# Patient Record
Sex: Male | Born: 1950 | Race: Black or African American | Hispanic: No | Marital: Married
Health system: Southern US, Community
[De-identification: ages and names within clinical notes are randomized; demographics above are authoritative.]

---

## 2009-09-09 ENCOUNTER — Other Ambulatory Visit: Admission: RE | Admit: 2009-09-09 | Discharge: 2009-09-09 | Payer: Self-pay | Admitting: Interventional Radiology

## 2009-09-09 ENCOUNTER — Encounter: Admission: RE | Admit: 2009-09-09 | Discharge: 2009-09-09 | Payer: Self-pay | Admitting: Endocrinology

## 2010-01-01 ENCOUNTER — Encounter (INDEPENDENT_AMBULATORY_CARE_PROVIDER_SITE_OTHER): Payer: Self-pay | Admitting: Surgery

## 2010-01-01 ENCOUNTER — Ambulatory Visit (HOSPITAL_COMMUNITY)
Admission: RE | Admit: 2010-01-01 | Discharge: 2010-01-02 | Payer: Self-pay | Source: Home / Self Care | Attending: Surgery | Admitting: Surgery

## 2010-03-30 LAB — CALCIUM: Calcium: 9 mg/dL (ref 8.4–10.5)

## 2010-03-30 LAB — GLUCOSE, CAPILLARY: Glucose-Capillary: 116 mg/dL — ABNORMAL HIGH (ref 70–99)

## 2010-03-31 LAB — COMPREHENSIVE METABOLIC PANEL
ALT: 20 U/L (ref 0–53)
AST: 20 U/L (ref 0–37)
Alkaline Phosphatase: 56 U/L (ref 39–117)
BUN: 15 mg/dL (ref 6–23)
Calcium: 9.3 mg/dL (ref 8.4–10.5)
GFR calc Af Amer: >60 mL/min (ref >60)
GFR calc non Af Amer: >60 mL/min (ref >60)
Total Bilirubin: 0.7 mg/dL (ref 0.3–1.2)

## 2010-03-31 LAB — CBC
MCV: 91.5 fL (ref 78.0–100.0)
RBC: 4.71 MIL/uL (ref 4.22–5.81)
RDW: 12.8 % (ref 11.5–15.5)
WBC: 8 10*3/uL (ref 4.0–10.5)

## 2010-03-31 LAB — DIFFERENTIAL
Basophils Relative: 0 % (ref 0–1)
Eosinophils Absolute: 0.1 10*3/uL (ref 0.0–0.7)
Lymphocytes Relative: 43 % (ref 12–46)
Monocytes Absolute: 0.6 10*3/uL (ref 0.1–1.0)
Neutrophils Relative %: 49 % (ref 43–77)

## 2010-03-31 LAB — URINALYSIS, ROUTINE W REFLEX MICROSCOPIC
Protein, ur: NEGATIVE mg/dL
Specific Gravity, Urine: 1.018 (ref 1.005–1.030)

## 2010-03-31 LAB — URINE MICROSCOPIC-ADD ON

## 2010-03-31 LAB — SURGICAL PCR SCREEN: MRSA, PCR: NEGATIVE

## 2010-03-31 LAB — PROTIME-INR: INR: 0.95 (ref 0.00–1.49)

## 2011-12-28 IMAGING — US US BIOPSY
1 series · 14 of 15 positions shown · non-contrast
Comparison: none

Clinical Data/Indication: Bilateral thyroid nodules

Ultrasound-guided biopsyof bilateral thyroid nodules.  Fine needle
aspiration.
Procedure: The procedure, risks, benefits, and alternatives were
explained to the patient. Questions regarding the procedure were
encouraged and answered. The patient understands and consents to
the procedure.
The neck was prepped with betadine in a sterile fashion, and a
sterile drape was applied covering the operative field.
Under sonographic guidance, three 25 gauge fine needle aspirates of
the dominant right thyroid nodule and three 25 gauge fine needle
aspirates of the dominant left thyroid nodule were obtained. Final
imaging was performed.

[Series 1: us biopsy · 0.09mm/px · 15 acquisitions, 14 frames shown]
[im 1/15]
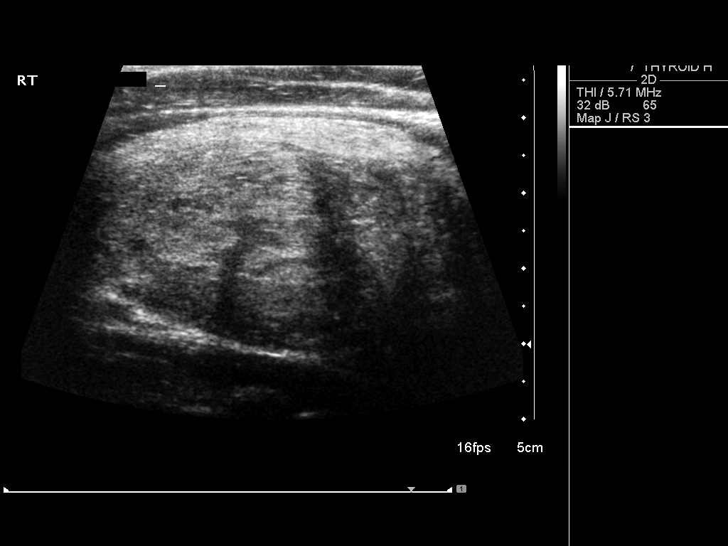
[im 2/15]
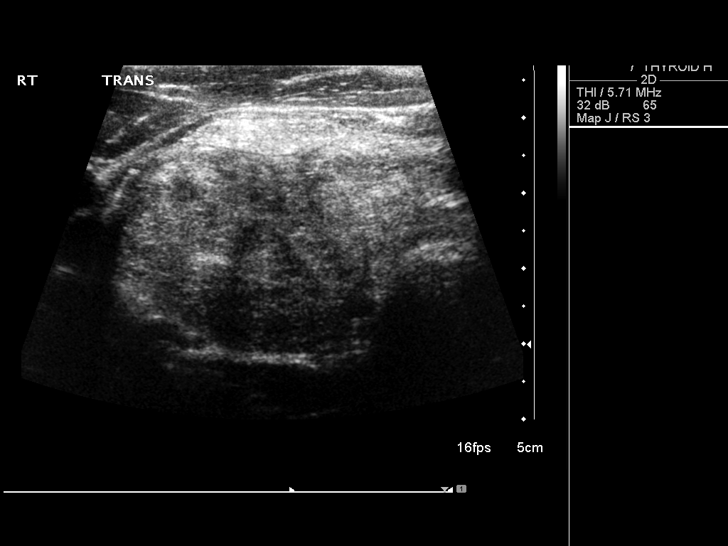
[im 3/15]
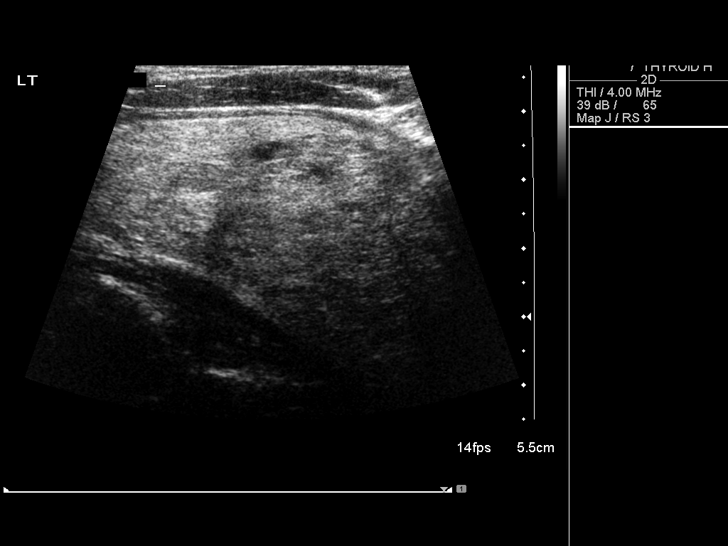
[im 4/15]
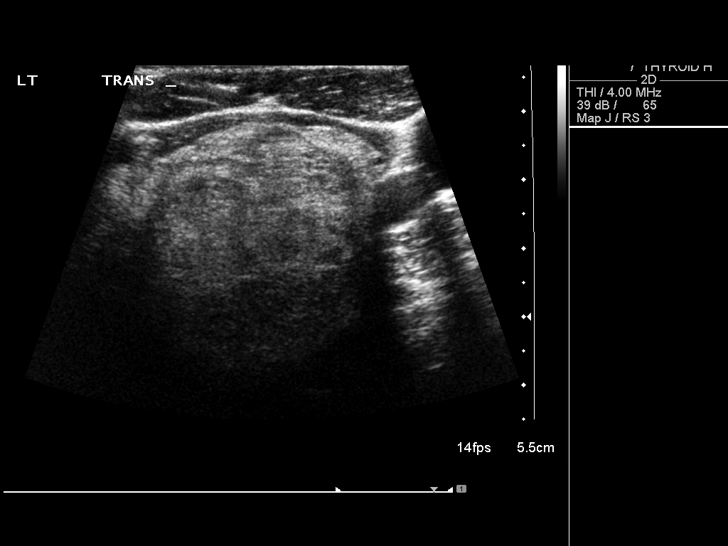
[im 5/15]
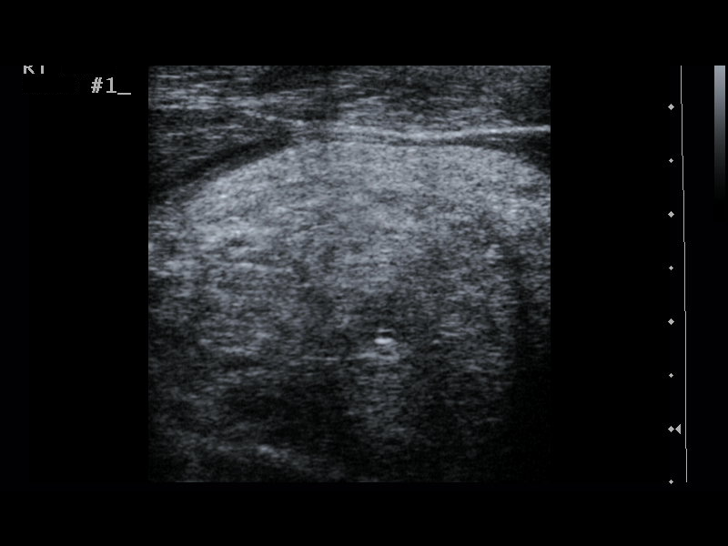
[im 6/15]
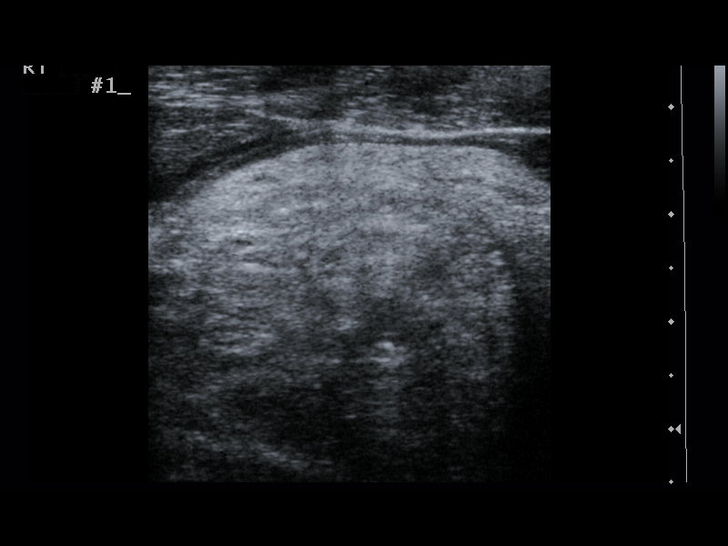
[im 7/15]
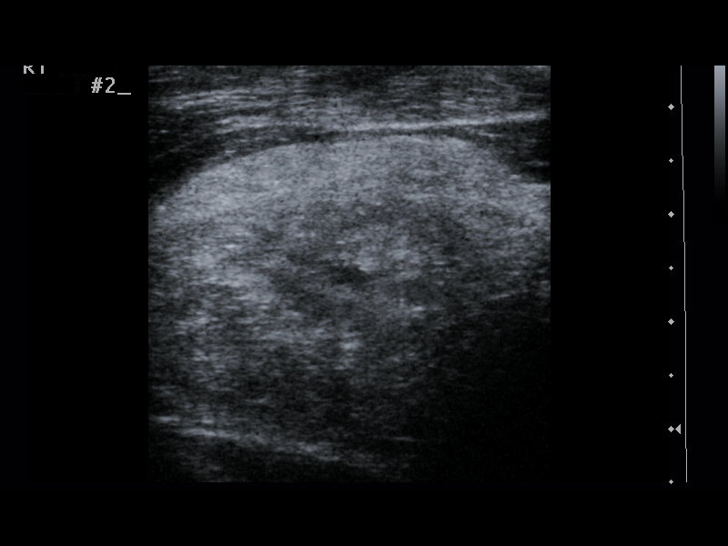
[im 9/15]
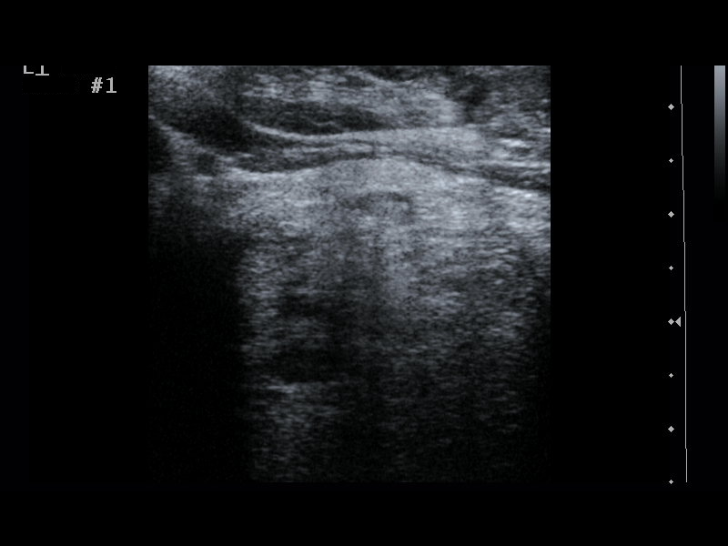
[im 10/15]
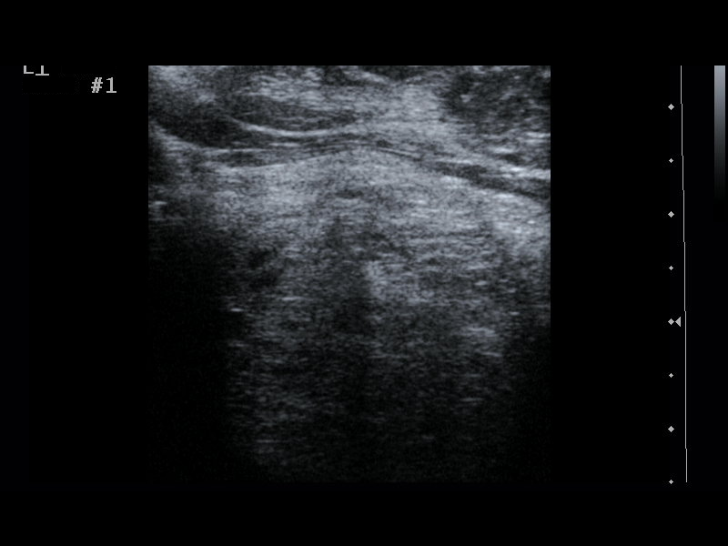
[im 11/15]
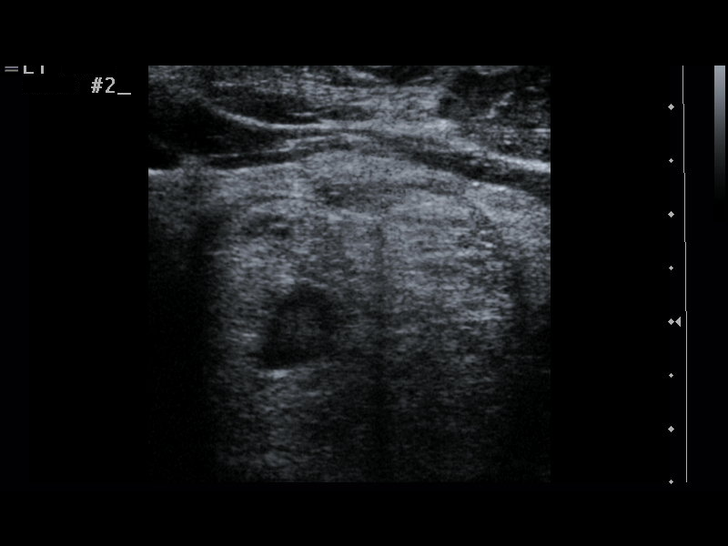
[im 12/15]
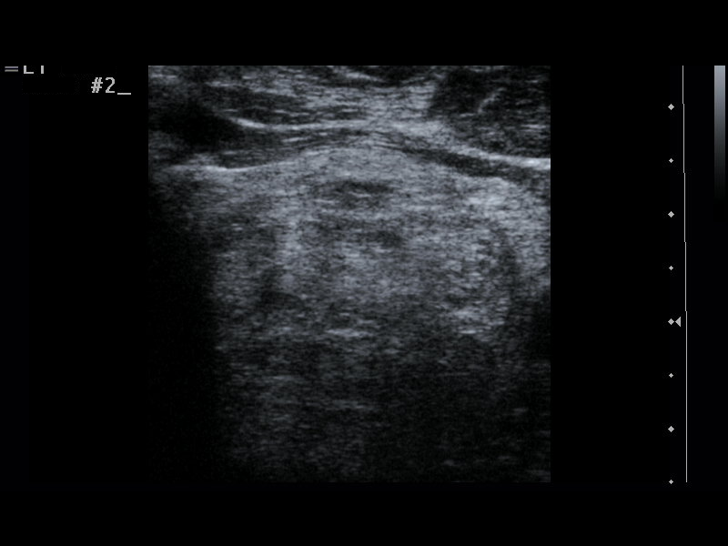
[im 13/15]
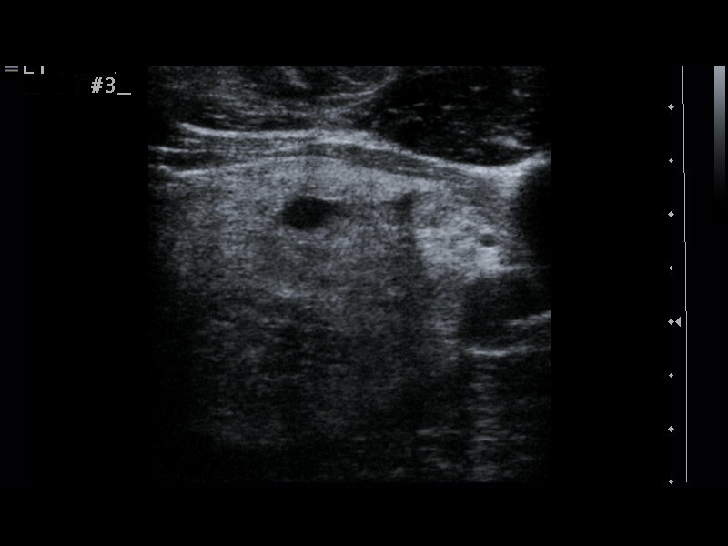
[im 14/15]
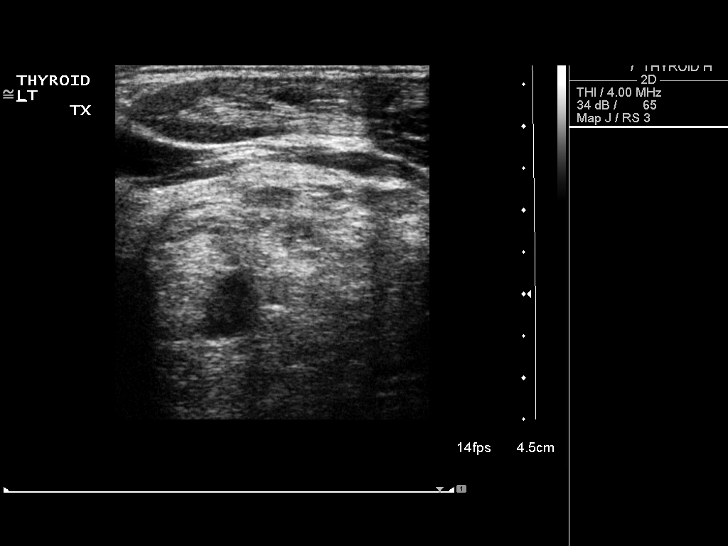
[im 15/15]
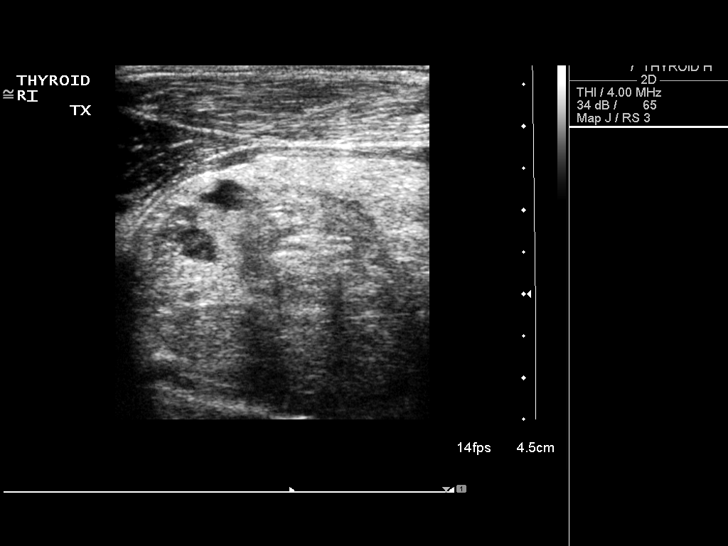

[14 of 15 positions shown; findings below may reference images not displayed]

FINDINGS: The images document guide needle placement within the
bilateral dominant thyroid nodules. Post biopsy images demonstrate
no evidence of hemorrhage.
IMPRESSION: Successful ultrasound-guided fine needle aspiration of dominant
bilateral thyroid nodules.

## 2012-04-17 IMAGING — CR DG CHEST 2V
2 series · 2 of 2 positions shown · non-contrast
Comparison: None

CLINICAL DATA: Preop for thyroidectomy.

CHEST - 2 VIEW

[view not recorded (1 of 2)]
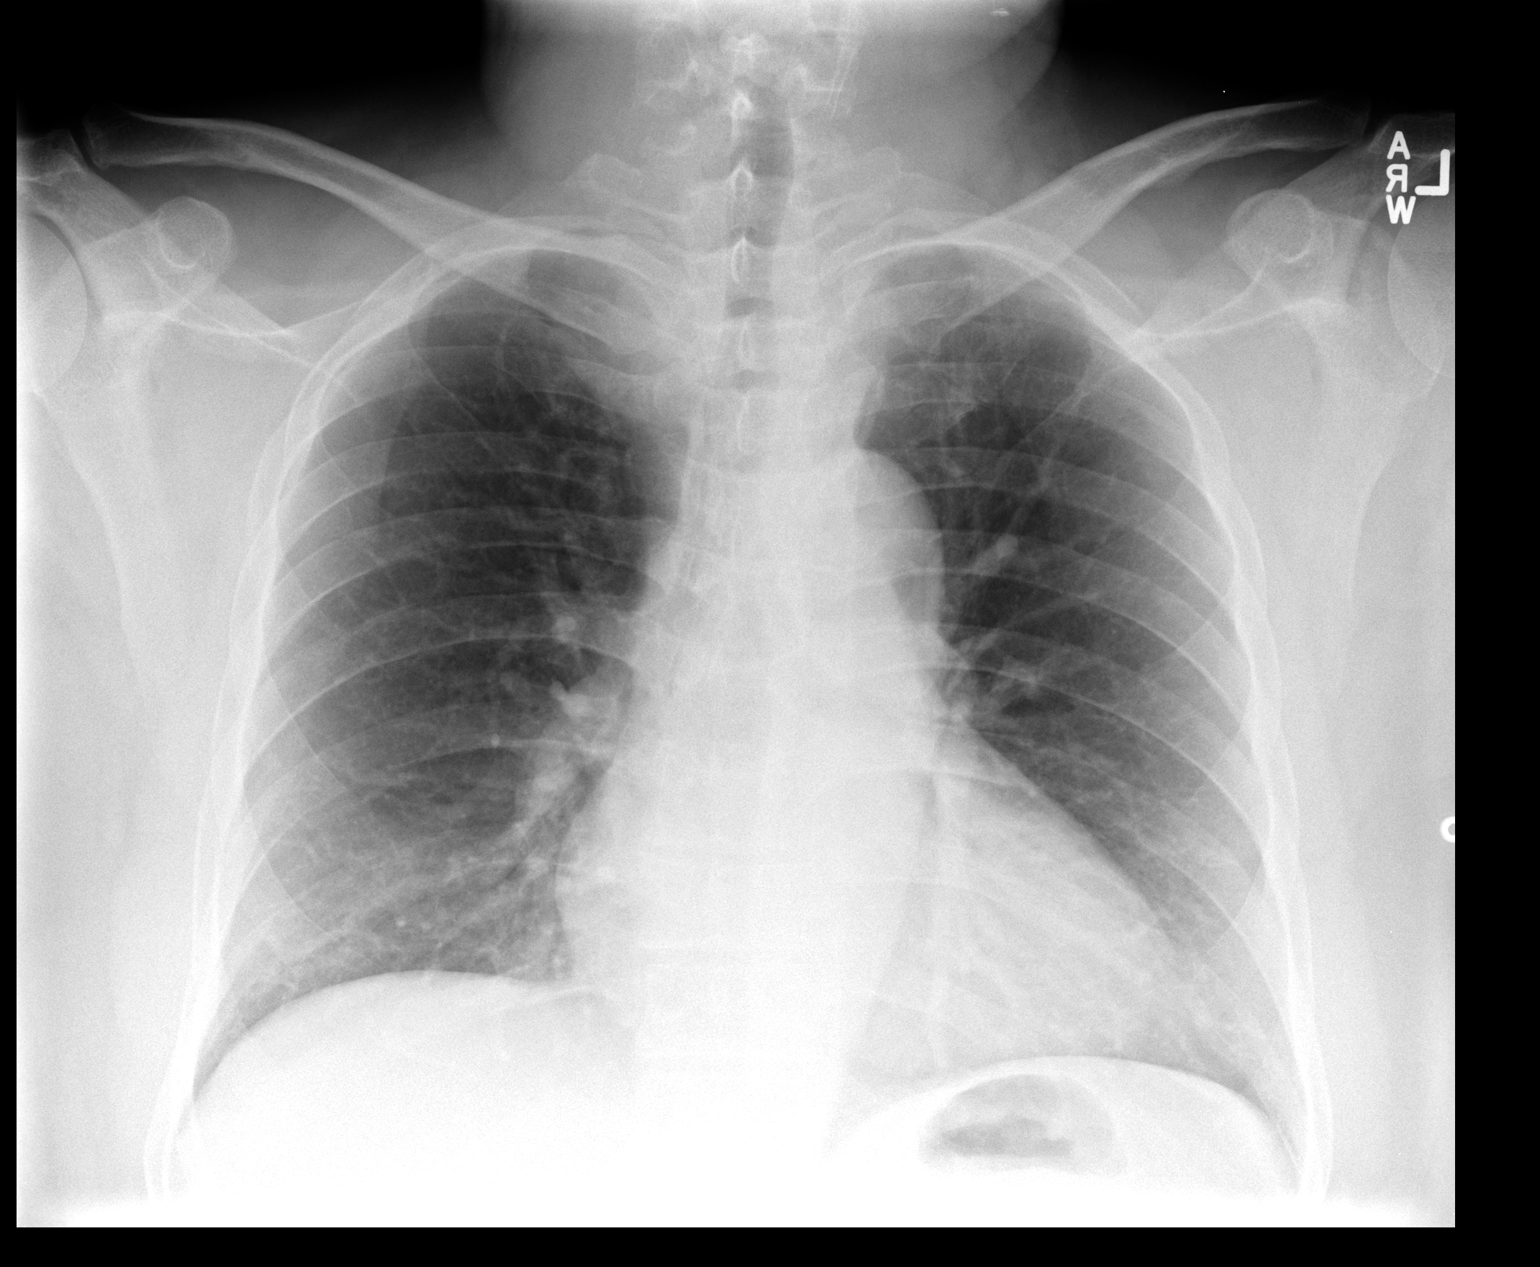

[view not recorded (2 of 2)]
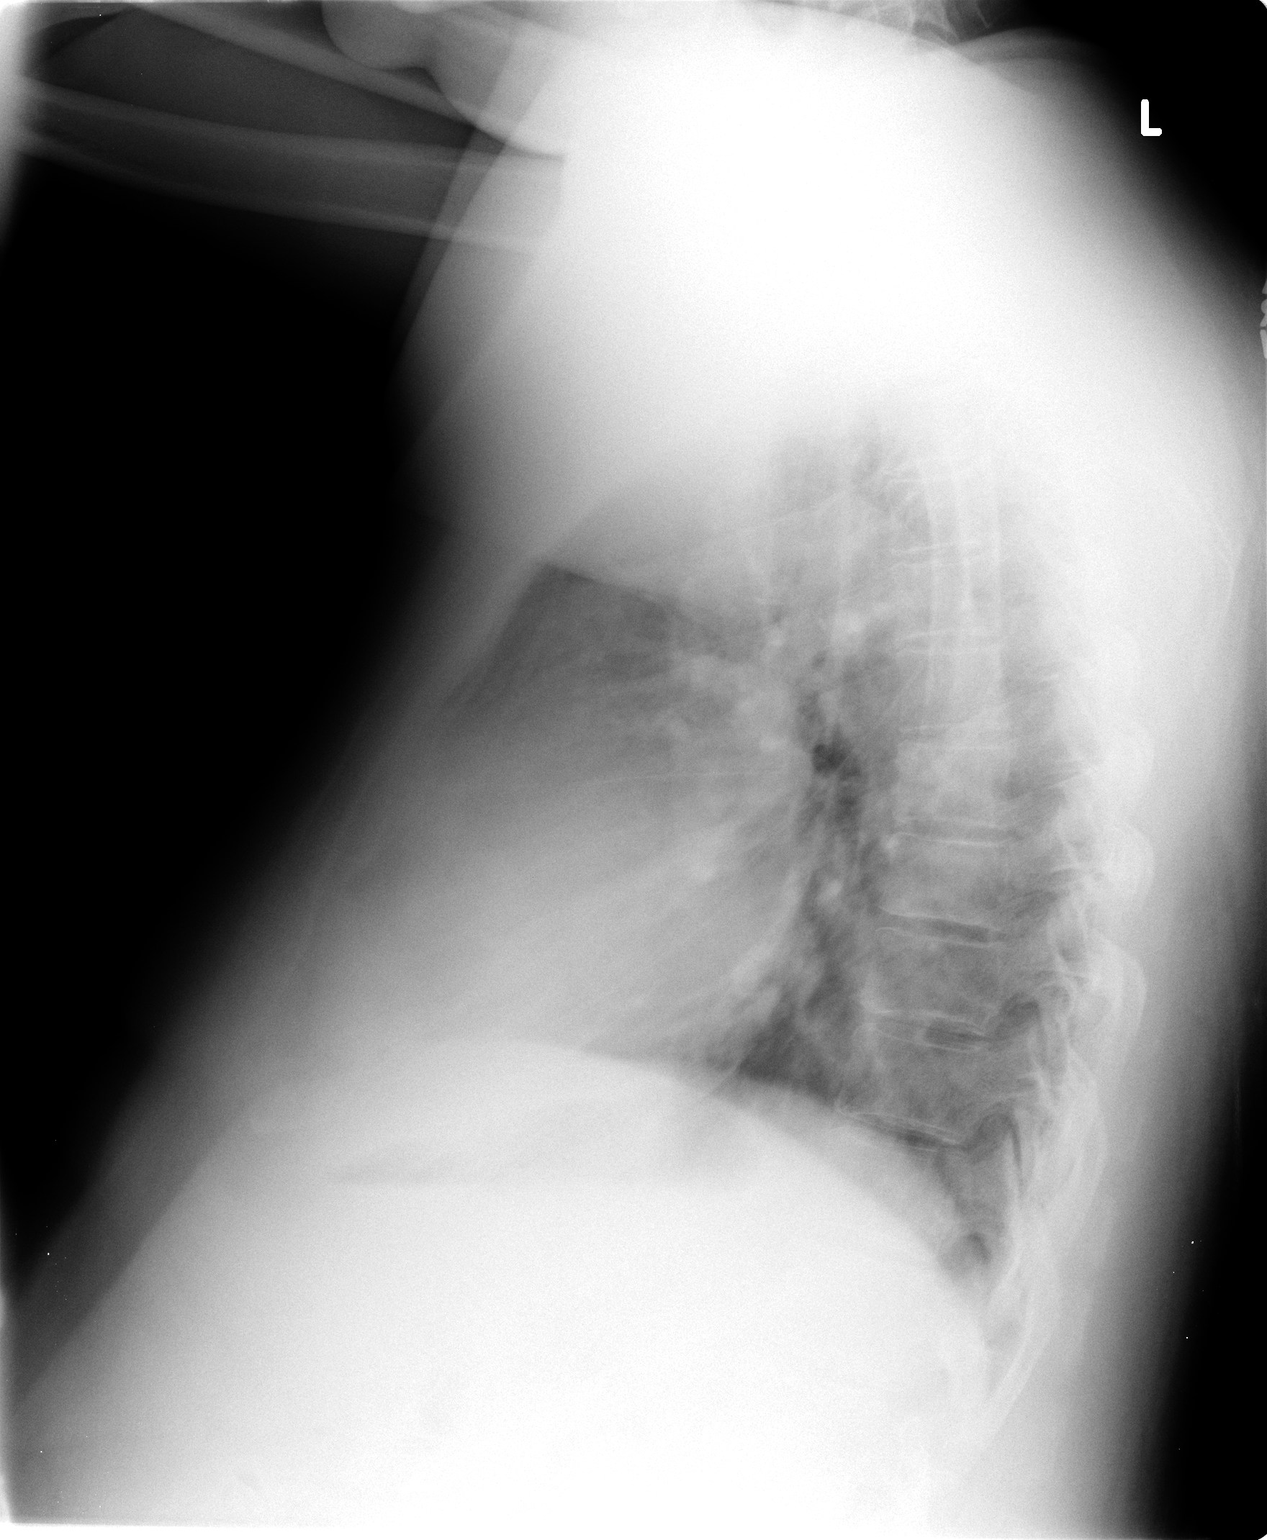

[2 of 2 positions shown; findings below may reference images not displayed]

FINDINGS: The cardiac silhouette, mediastinal and hilar contours
are within normal limits.  The lungs are clear except for minimal
basilar atelectasis or scarring.  No infiltrates, edema or
effusions.  The bony thorax is intact.
IMPRESSION: No acute cardiopulmonary findings.

## 2014-05-14 ENCOUNTER — Telehealth: Payer: Self-pay | Admitting: *Deleted

## 2014-05-14 NOTE — Telephone Encounter (Deleted)
Received fax from pharmacy that

## 2014-05-14 NOTE — Telephone Encounter (Signed)
error
